# Patient Record
Sex: Male | Born: 1950 | Hispanic: Yes | Marital: Single | Smoking: Current some day smoker
Health system: Southern US, Community
[De-identification: ages and names within clinical notes are randomized; demographics above are authoritative.]

## PROBLEM LIST (undated history)

## (undated) HISTORY — PX: APPENDECTOMY: SHX54

---

## 2017-04-20 ENCOUNTER — Emergency Department: Payer: Self-pay

## 2017-04-20 ENCOUNTER — Emergency Department
Admission: EM | Admit: 2017-04-20 | Discharge: 2017-04-21 | Disposition: A | Discharge status: Home / Self Care | Payer: Self-pay | Attending: Emergency Medicine | Admitting: Emergency Medicine

## 2017-04-20 DIAGNOSIS — R55 Syncope and collapse: Secondary | ICD-10-CM | POA: Insufficient documentation

## 2017-04-20 DIAGNOSIS — F17210 Nicotine dependence, cigarettes, uncomplicated: Secondary | ICD-10-CM | POA: Insufficient documentation

## 2017-04-20 LAB — BASIC METABOLIC PANEL
Anion gap: 9 (ref 5–15)
BUN: 20 mg/dL (ref 6–20)
CALCIUM: 8.7 mg/dL — AB (ref 8.9–10.3)
CHLORIDE: 102 mmol/L (ref 101–111)
CO2: 25 mmol/L (ref 22–32)
Creatinine, Ser: 0.99 mg/dL (ref 0.61–1.24)
GFR calc Af Amer: >60 mL/min (ref >60)
GFR calc non Af Amer: >60 mL/min (ref >60)
GLUCOSE: 98 mg/dL (ref 65–99)
Potassium: 4.2 mmol/L (ref 3.5–5.1)
Sodium: 136 mmol/L (ref 135–145)

## 2017-04-20 LAB — CBC
HEMATOCRIT: 35.8 % — AB (ref 40.0–52.0)
Hemoglobin: 12.6 g/dL — ABNORMAL LOW (ref 13.0–18.0)
MCH: 32.7 pg (ref 26.0–34.0)
MCHC: 35.1 g/dL (ref 32.0–36.0)
MCV: 93.1 fL (ref 80.0–100.0)
Platelets: 256 10*3/uL (ref 150–440)
RBC: 3.85 MIL/uL — ABNORMAL LOW (ref 4.40–5.90)
RDW: 13.3 % (ref 11.5–14.5)
WBC: 5.4 10*3/uL (ref 3.8–10.6)

## 2017-04-20 LAB — TROPONIN I: Troponin I: <0.03 ng/mL (ref <0.03)

## 2017-04-20 MED ORDER — SODIUM CHLORIDE 0.9 % IV BOLUS (SEPSIS)
1000.0000 mL | Freq: Once | INTRAVENOUS | Status: AC
  Administered 2017-04-20: 1000 mL via INTRAVENOUS

## 2017-04-20 NOTE — ED Triage Notes (Signed)
Pt to the ER via EMS for evaluation after a fall while working. 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 Denies hitting his head but was very confused when EMS arrived. He was shaky and confused and may have experienced LOC. 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 Pt states he has been working all day and his last meal was at 1800.

## 2017-04-20 NOTE — ED Notes (Signed)
Attempted to call and update supervisor Ralene Bathe [phone] unsuccessful

## 2017-04-20 NOTE — ED Notes (Signed)
Patient transported to CT 

## 2017-04-21 ENCOUNTER — Encounter: Payer: Self-pay | Admitting: Emergency Medicine

## 2017-04-21 LAB — TROPONIN I: Troponin I: <0.03 ng/mL (ref <0.03)

## 2017-04-21 NOTE — ED Provider Notes (Signed)
[facility] Emergency Department Provider Note   ____________________________________________   First MD Initiated Contact with Patient 04/20/17 2317     (approximate)  I have reviewed the triage vital signs and the nursing notes.   HISTORY  Chief Complaint Fall    HPI Kyle Esparza is a 66 y.o. male who comes into the hospital today with a fall. 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 He didn't fall off the roof but he passed out. When the patient came to he was shaky and confused. The patient had been working for 15 hours straight today. He states that he feels much better now. He does not remember falling. He states before the episode he was just working. The patient states that he has been drinking water all day and he is around 6 PM. He denies any chest pain, shortness of breath, nausea, vomiting. He was sweating today but states that he has been outside and is hot. The patient denies any pain anywhere. The patient has never passed out like this before. the patient does not think he hit his head but he does not remember anything and he is unsure how long he was out. According to EMS he was only unresponsive for a few minutes. He is here today for evaluation.   History reviewed. No pertinent past medical history.  There are no active problems to display for this patient.   Past Surgical History:  Procedure Laterality Date  . APPENDECTOMY      Prior to Admission medications   Not on File    Allergies Patient has no known allergies.  No family history on file.  Social History Social History  Substance Use Topics  . Smoking status: Current Some Day Smoker    Packs/day: 0.50    Types: Cigarettes  . Smokeless tobacco: Never Used  . Alcohol use 0.6 oz/week    1 Cans of beer per week    Review of Systems  Constitutional: No fever/chills Eyes: No visual changes. ENT: No sore  throat. Cardiovascular: Denies chest pain. Respiratory: Denies shortness of breath. Gastrointestinal: No abdominal pain.  No nausea, no vomiting.  No diarrhea.  No constipation. Genitourinary: Negative for dysuria. Musculoskeletal: Negative for back pain. Skin: Negative for rash. Neurological: syncope and confusion   ____________________________________________   PHYSICAL EXAM:  VITAL SIGNS: ED Triage Vitals  Enc Vitals Group     BP --      Pulse Rate 04/20/17 2315 73     Resp 04/20/17 2315 16     Temp --      Temp src --      SpO2 04/20/17 2306 100 %     Weight 04/20/17 2309 168 lb (76.2 kg)     Height 04/20/17 2309  (1.651 m)     Head Circumference --      Peak Flow --      Pain Score --      Pain Loc --      Pain Edu? --      Excl. in GC? --     Constitutional: Alert and oriented. Well appearing and in no acute distress. Eyes: Conjunctivae are normal. PERRL. EOMI. Head: Atraumatic. Nose: No congestion/rhinnorhea. Mouth/Throat: Mucous membranes are moist.  Oropharynx non-erythematous. Cardiovascular: Normal rate, regular rhythm. Grossly normal heart sounds.  Good peripheral circulation. Respiratory: Normal respiratory effort.  No retractions. Lungs CTAB. Gastrointestinal: Soft and nontender. No distention. positive bowel sounds  Musculoskeletal: No lower extremity tenderness nor edema.   Neurologic:  Normal speech and language. cranial nerves II through XII are grossly intact with no focal motor or neuro deficit Skin:  Skin is warm, dry and intact. No rash noted. Psychiatric: Mood and affect are normal. Speech and behavior are normal.  ____________________________________________   LABS (all labs ordered are listed, but only abnormal results are displayed)  Labs Reviewed  CBC - Abnormal; Notable for the following:       Result Value   RBC 3.85 (*)    Hemoglobin 12.6 (*)    HCT 35.8 (*)    All other components within normal limits  BASIC METABOLIC PANEL  - Abnormal; Notable for the following:    Calcium 8.7 (*)    All other components within normal limits  TROPONIN I  TROPONIN I   ____________________________________________  EKG  ED ECG REPORT I, Rebecka Apley, the attending physician, personally viewed and interpreted this ECG.   Date: 04/20/2017  EKG Time: 2310  Rate: 72  Rhythm: normal sinus rhythm  Axis: normal  Intervals:none  ST&T Change: none  ____________________________________________  RADIOLOGY  Ct Head Wo Contrast  Result Date: 04/21/2017 CLINICAL DATA:  Patient fell while working with minor head trauma. Confusion. Possible loss of consciousness. Hematoma to the left base of head present for 4 years. EXAM: CT HEAD WITHOUT CONTRAST CT CERVICAL SPINE WITHOUT CONTRAST TECHNIQUE: Multidetector CT imaging of the head and cervical spine was performed following the standard protocol without intravenous contrast. Multiplanar CT image reconstructions of the cervical spine were also generated. COMPARISON:  None. FINDINGS: CT HEAD FINDINGS Brain: Mild cerebral atrophy. No evidence of acute infarction, hemorrhage, hydrocephalus, extra-axial collection or mass lesion/mass effect. Vascular: Mild vascular calcifications in the internal carotid arteries. Skull: Calvarium appears intact. Mild right supraorbital soft tissue scalp swelling/hematoma. Sinuses/Orbits: Mucosal thickening in the paranasal sinuses. No acute air-fluid levels. Mastoid air cells are clear. Other: Subcutaneous soft tissue lipoma in the posterior left neck inferior to the skullbase. CT CERVICAL SPINE FINDINGS Alignment: There is straightening of the usual cervical lordosis with slight retrolisthesis of C6 on C7. These changes are likely degenerative. However, ligamentous injury or muscle spasm could also have this appearance and are not excluded. Correlation with physical examination is recommended. Normal alignment of the facet joints. C1-2 articulation appears  intact. 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 Skull base and vertebrae: No vertebral compression deformities. No focal bone lesion or bone destruction. Soft tissues and spinal canal: No prevertebral soft tissue swelling. No paraspinal soft tissue infiltration. No paraspinal mass lesions. Disc levels: Degenerative changes diffusely throughout the cervical spine with narrowed interspaces and endplate hypertrophic changes. Degenerative changes are most prominent at C3-4, C5-6, and C6-7 levels. Degenerative changes throughout the cervical facet joints. Upper chest: Emphysematous changes and scarring in the lung apices. Other: None. IMPRESSION: 1. No acute intracranial abnormalities.  Mild chronic atrophy. 2. Nonspecific straightening of usual cervical lordosis. Degenerative changes in the cervical spine. No acute displaced fractures identified. Electronically Signed   By: Burman Nieves M.D.   On: 04/21/2017 00:04   Ct Cervical Spine Wo Contrast  Result Date: 04/21/2017 CLINICAL DATA:  Patient fell while working with minor head trauma. Confusion. Possible loss of consciousness. Hematoma to the left base of head present for 4 years. EXAM: CT HEAD WITHOUT CONTRAST CT CERVICAL SPINE WITHOUT CONTRAST TECHNIQUE: Multidetector CT imaging of the head and cervical spine was performed following the  standard protocol without intravenous contrast. Multiplanar CT image reconstructions of the cervical spine were also generated. COMPARISON:  None. FINDINGS: CT HEAD FINDINGS Brain: Mild cerebral atrophy. No evidence of acute infarction, hemorrhage, hydrocephalus, extra-axial collection or mass lesion/mass effect. Vascular: Mild vascular calcifications in the internal carotid arteries. Skull: Calvarium appears intact. Mild right supraorbital soft tissue scalp swelling/hematoma. Sinuses/Orbits: Mucosal thickening in the paranasal sinuses. No acute air-fluid levels. Mastoid air cells are clear.  Other: Subcutaneous soft tissue lipoma in the posterior left neck inferior to the skullbase. CT CERVICAL SPINE FINDINGS Alignment: There is straightening of the usual cervical lordosis with slight retrolisthesis of C6 on C7. These changes are likely degenerative. However, ligamentous injury or muscle spasm could also have this appearance and are not excluded. Correlation with physical examination is recommended. Normal alignment of the facet joints. C1-2 articulation appears intact. 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 Skull base and vertebrae: No vertebral compression deformities. No focal bone lesion or bone destruction. Soft tissues and spinal canal: No prevertebral soft tissue swelling. No paraspinal soft tissue infiltration. No paraspinal mass lesions. Disc levels: Degenerative changes diffusely throughout the cervical spine with narrowed interspaces and endplate hypertrophic changes. Degenerative changes are most prominent at C3-4, C5-6, and C6-7 levels. Degenerative changes throughout the cervical facet joints. Upper chest: Emphysematous changes and scarring in the lung apices. Other: None. IMPRESSION: 1. No acute intracranial abnormalities.  Mild chronic atrophy. 2. Nonspecific straightening of usual cervical lordosis. Degenerative changes in the cervical spine. No acute displaced fractures identified. Electronically Signed   By: Burman Nieves M.D.   On: 04/21/2017 00:04    ____________________________________________   PROCEDURES  Procedure(s) performed: None  Procedures  Critical Care performed: No  ____________________________________________   INITIAL IMPRESSION / ASSESSMENT AND PLAN / ED COURSE  Pertinent labs & imaging results that were available during my care of the patient were reviewed by me and considered in my medical decision making (see chart for details).  this is a 66 year old male who comes into the hospital today with a fall. The  patient lays roofing shingles as a profession.  My differential diagnosis includes syncope due to dehydration versus cardiac cause versus neurologic cause of syncope.  After listening to the history is somewhat the patient syncopized. He had been laying roof tile for multiple hours today. Although he states that he has been drinking water he was sweating significantly. I check some blood work which was unremarkable. I also performed a CT scan of the patient's head and cervical spine. The patient's initial blood work came back unremarkable. While he was disoriented he has returned to his baseline. I gave the patient liter of normal saline and repeated his troponin as well. His troponin returned unremarkable. The patient remains asymptomatic at this time. He'll be discharged to home to follow-up with his primary care physician.      ____________________________________________   FINAL CLINICAL IMPRESSION(S) / ED DIAGNOSES  Final diagnoses:  Syncope, unspecified syncope type      NEW MEDICATIONS STARTED DURING THIS VISIT:  There are no discharge medications for this patient.    Note:  This document was prepared using Dragon voice recognition software and may include unintentional dictation errors.    Rebecka Apley, MD 04/21/17 [phone]

## 2017-04-21 NOTE — ED Notes (Signed)
Pt unhooked from the monitor and told to call his transportation.

## 2017-04-21 NOTE — ED Notes (Signed)
Patient is resting comfortably. 

## 2018-01-31 IMAGING — CT CT HEAD W/O CM
4 of 16 series · 14 of 47 positions shown, 16 images · non-contrast
Comparison: None.

CLINICAL DATA: Patient fell while working with minor head trauma.
Confusion. Possible loss of consciousness. Hematoma to the left base
of head present for 4 years.

EXAM:
CT HEAD WITHOUT CONTRAST
CT CERVICAL SPINE WITHOUT CONTRAST
TECHNIQUE: Multidetector CT imaging of the head and cervical spine was
performed following the standard protocol without intravenous
contrast. Multiplanar CT image reconstructions of the cervical spine
were also generated.

[Series 2: head bone · axial · 0.40mm/px · z∈[+143,+227]mm · 4 of 72 slices shown]
[im 15/72  bone]
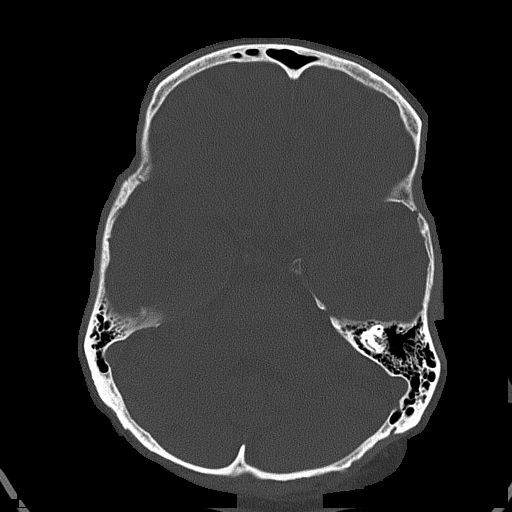
[im 29/72  bone]
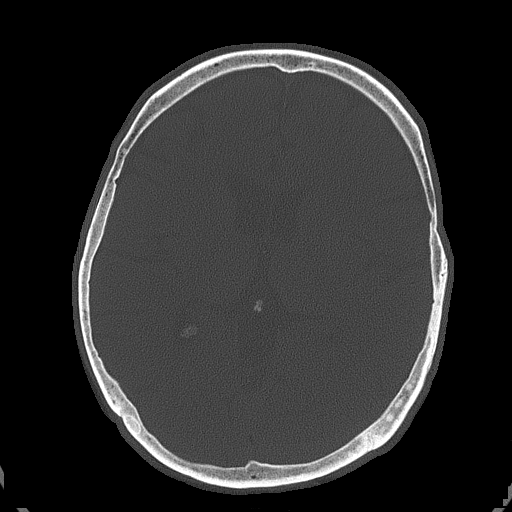
[im 43/72  bone]
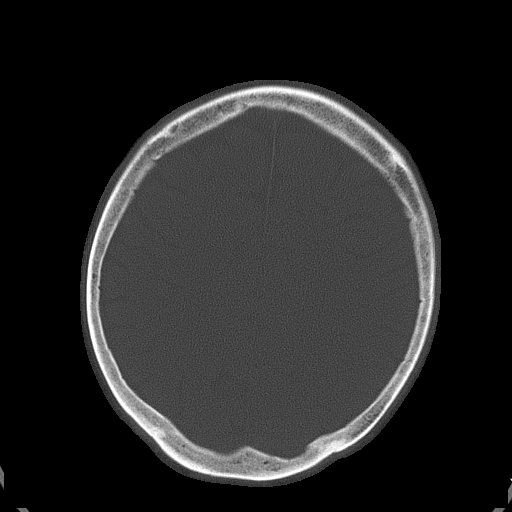
[im 57/72  bone]
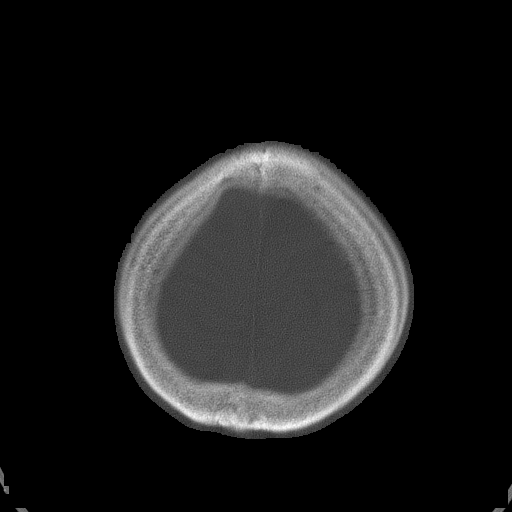

[Series 8: coronal soft tissue · coronal · 0.28mm/px · 1 of 64 slices shown]
[im 32/64  brain]
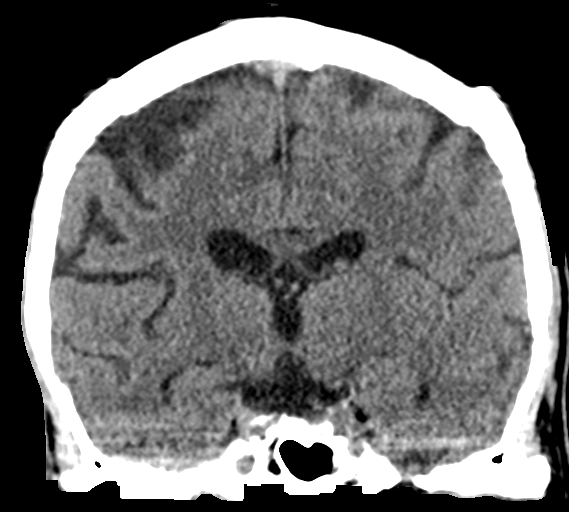

[Series 12: c spine soft · axial · 0.26mm/px · z∈[-26,+84]mm · 4 of 91 slices shown]
[im 19/91  brain]
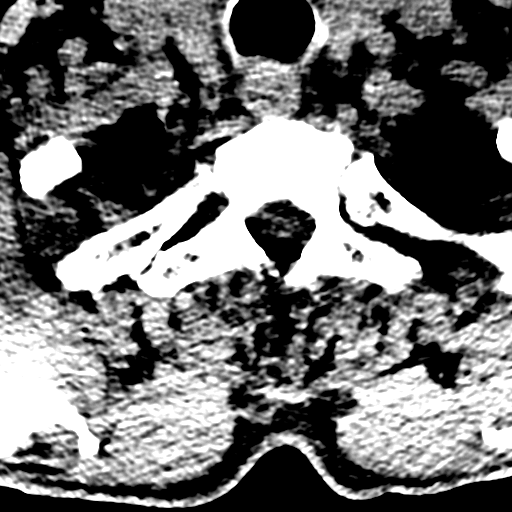
[im 37/91  brain]
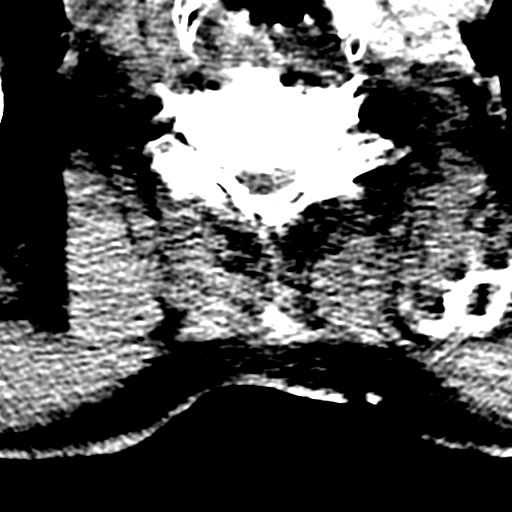
[im 55/91  brain]
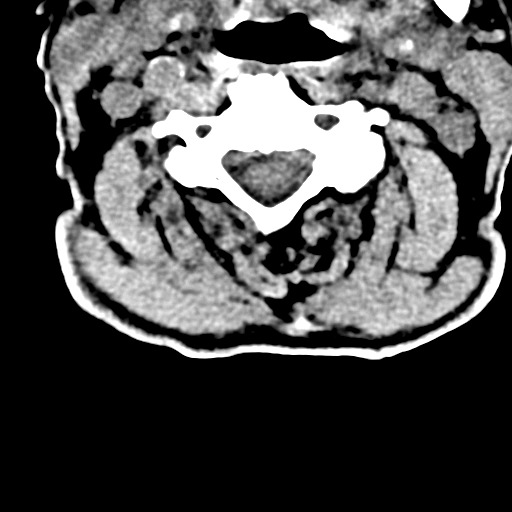
[im 73/91  brain]
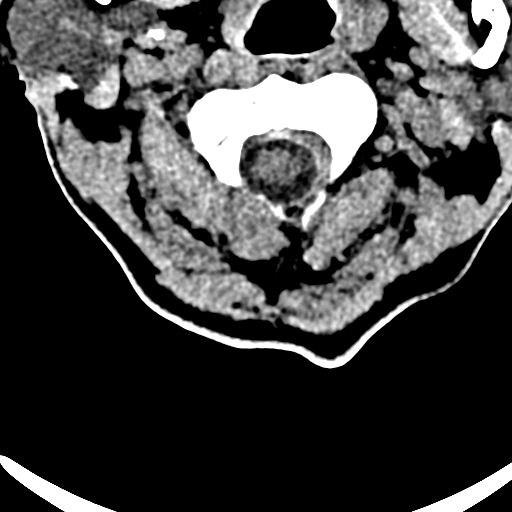

[Series 17: orthogonal bone · axial · 0.19mm/px · z∈[-50,+85]mm · 5 of 105 slices shown, 7 images]
[im 18/105  brain]
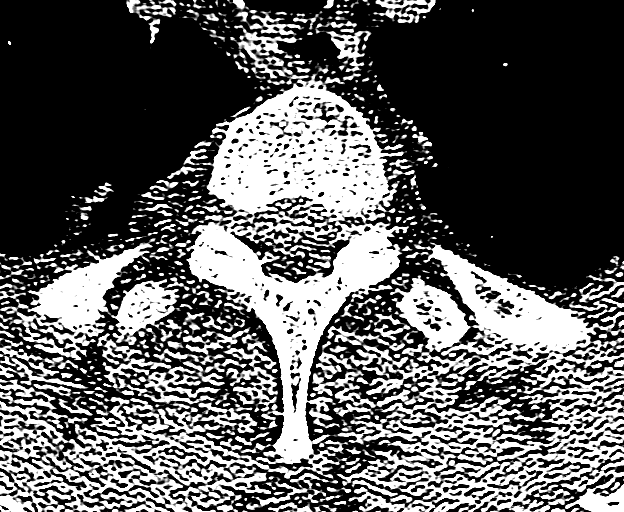
[im 18/105  bone]
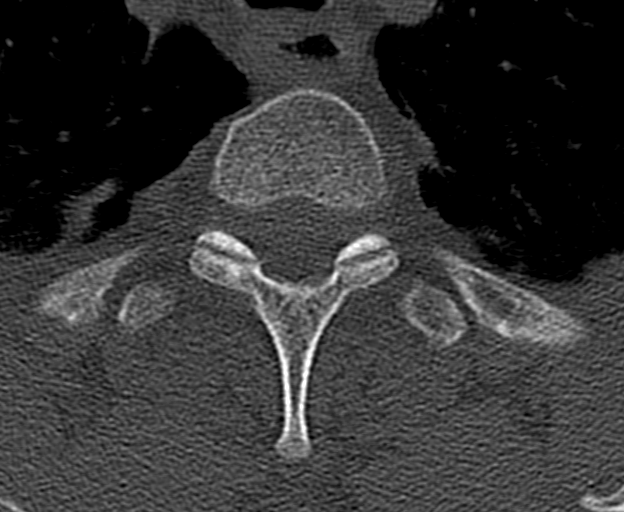
[im 35/105  brain]
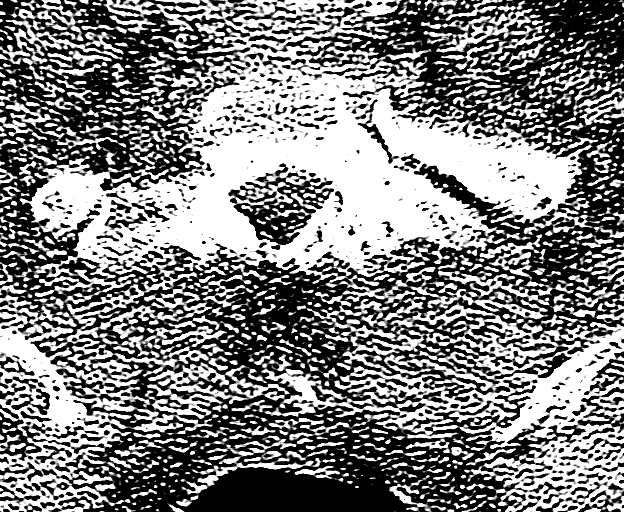
[im 53/105  brain]
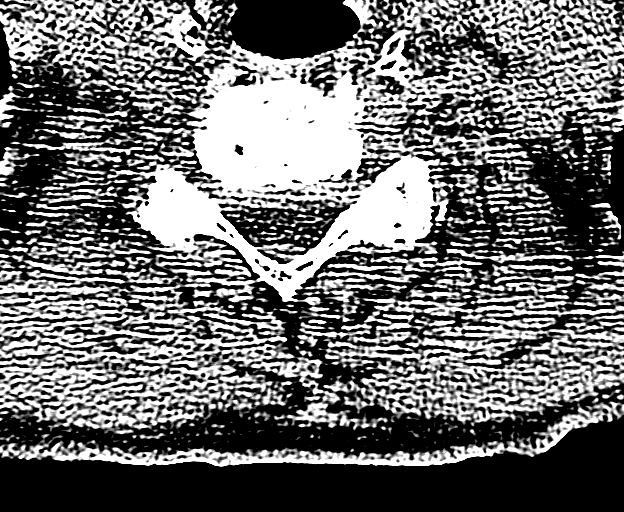
[im 70/105  brain]
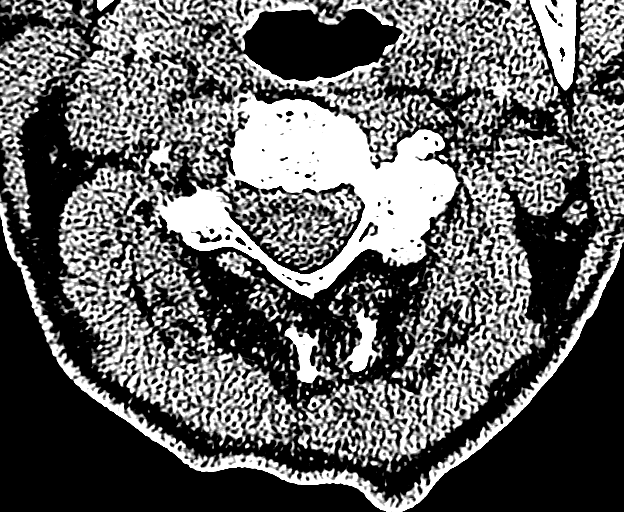
[im 87/105  brain]
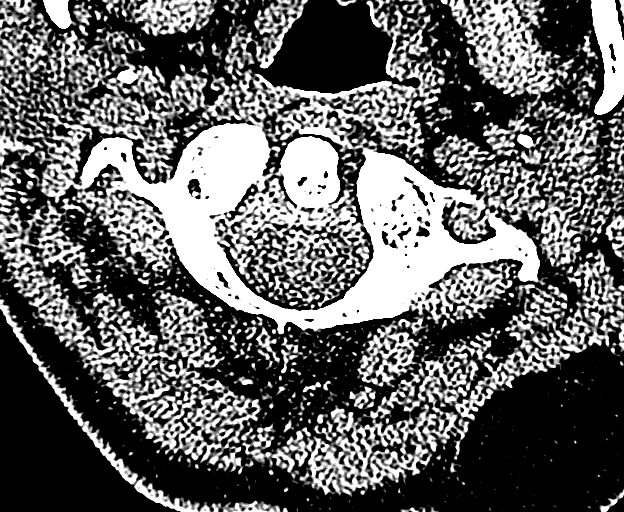
[im 87/105  bone]
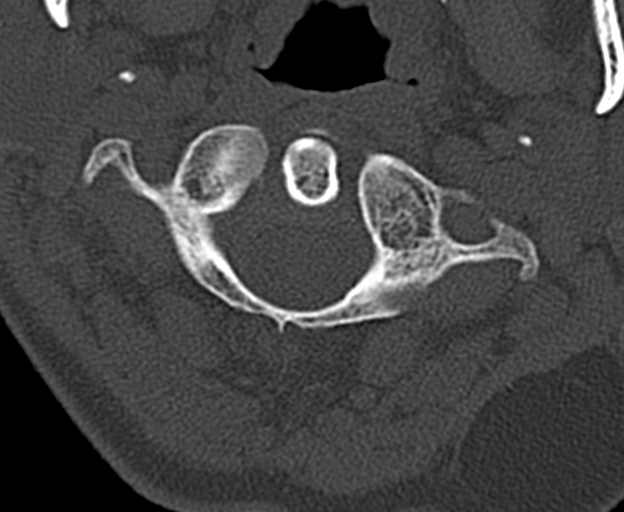

[14 of 47 positions shown; findings below may reference images not displayed]

FINDINGS: CT HEAD FINDINGS

Brain: Mild cerebral atrophy. No evidence of acute infarction,
hemorrhage, hydrocephalus, extra-axial collection or mass
lesion/mass effect.

Vascular: Mild vascular calcifications in the internal carotid
arteries.

Skull: Calvarium appears intact. Mild right supraorbital soft tissue
scalp swelling/hematoma.

Sinuses/Orbits: Mucosal thickening in the paranasal sinuses. No
acute air-fluid levels. Mastoid air cells are clear.

Other: Subcutaneous soft tissue lipoma in the posterior left neck
inferior to the skullbase.

CT CERVICAL SPINE FINDINGS

Alignment: There is straightening of the usual cervical lordosis
with slight retrolisthesis of C6 on C7. These changes are likely
degenerative. However, ligamentous injury or muscle spasm could also
have this appearance and are not excluded. Correlation with physical
examination is recommended. Normal alignment of the facet joints.
C1-2 articulation appears intact. Old ununited ossicle adjacent to
the posterior right lateral mass of C2.

Skull base and vertebrae: No vertebral compression deformities. No
focal bone lesion or bone destruction.

Soft tissues and spinal canal: No prevertebral soft tissue swelling.
No paraspinal soft tissue infiltration. No paraspinal mass lesions.

Disc levels: Degenerative changes diffusely throughout the cervical
spine with narrowed interspaces and endplate hypertrophic changes.
Degenerative changes are most prominent at C3-4, C5-6, and C6-7
levels. Degenerative changes throughout the cervical facet joints.

Upper chest: Emphysematous changes and scarring in the lung apices.

Other: None.
IMPRESSION: 1. No acute intracranial abnormalities.  Mild chronic atrophy.
2. Nonspecific straightening of usual cervical lordosis.
Degenerative changes in the cervical spine. No acute displaced
fractures identified.
